# Patient Record
Sex: Female | Born: 1970 | Race: White | Hispanic: No | Marital: Single
Health system: Southern US, Community
[De-identification: ages and names within clinical notes are randomized; demographics above are authoritative.]

---

## 2000-12-18 ENCOUNTER — Emergency Department (HOSPITAL_COMMUNITY): Admission: EM | Admit: 2000-12-18 | Discharge: 2000-12-18 | Payer: Self-pay | Admitting: Emergency Medicine

## 2001-01-14 ENCOUNTER — Emergency Department (HOSPITAL_COMMUNITY): Admission: EM | Admit: 2001-01-14 | Discharge: 2001-01-15 | Payer: Self-pay | Admitting: Emergency Medicine

## 2001-07-09 ENCOUNTER — Emergency Department (HOSPITAL_COMMUNITY): Admission: EM | Admit: 2001-07-09 | Discharge: 2001-07-09 | Payer: Self-pay | Admitting: Emergency Medicine

## 2001-08-16 ENCOUNTER — Emergency Department (HOSPITAL_COMMUNITY): Admission: EM | Admit: 2001-08-16 | Discharge: 2001-08-16 | Payer: Self-pay | Admitting: *Deleted

## 2001-09-28 ENCOUNTER — Emergency Department (HOSPITAL_COMMUNITY): Admission: EM | Admit: 2001-09-28 | Discharge: 2001-09-28 | Payer: Self-pay | Admitting: Emergency Medicine

## 2001-12-22 ENCOUNTER — Emergency Department (HOSPITAL_COMMUNITY): Admission: EM | Admit: 2001-12-22 | Discharge: 2001-12-22 | Payer: Self-pay | Admitting: *Deleted

## 2001-12-25 ENCOUNTER — Emergency Department (HOSPITAL_COMMUNITY): Admission: EM | Admit: 2001-12-25 | Discharge: 2001-12-25 | Payer: Self-pay | Admitting: Emergency Medicine

## 2019-06-15 ENCOUNTER — Other Ambulatory Visit (HOSPITAL_BASED_OUTPATIENT_CLINIC_OR_DEPARTMENT_OTHER): Payer: Self-pay | Admitting: Osteopathic Medicine

## 2019-06-17 ENCOUNTER — Other Ambulatory Visit (HOSPITAL_BASED_OUTPATIENT_CLINIC_OR_DEPARTMENT_OTHER): Payer: Self-pay | Admitting: Osteopathic Medicine

## 2019-06-17 DIAGNOSIS — R1011 Right upper quadrant pain: Secondary | ICD-10-CM

## 2019-06-18 ENCOUNTER — Other Ambulatory Visit: Payer: Self-pay

## 2019-06-18 ENCOUNTER — Ambulatory Visit (HOSPITAL_BASED_OUTPATIENT_CLINIC_OR_DEPARTMENT_OTHER)
Admission: RE | Admit: 2019-06-18 | Discharge: 2019-06-18 | Disposition: A | Discharge status: Home / Self Care | Payer: 59 | Source: Ambulatory Visit | Attending: Osteopathic Medicine | Admitting: Osteopathic Medicine

## 2019-06-18 DIAGNOSIS — R1011 Right upper quadrant pain: Secondary | ICD-10-CM

## 2020-12-22 ENCOUNTER — Ambulatory Visit: Payer: 59 | Admitting: Family Medicine

## 2020-12-24 ENCOUNTER — Other Ambulatory Visit: Payer: Self-pay

## 2020-12-24 ENCOUNTER — Ambulatory Visit (HOSPITAL_BASED_OUTPATIENT_CLINIC_OR_DEPARTMENT_OTHER)
Admission: RE | Admit: 2020-12-24 | Discharge: 2020-12-24 | Disposition: A | Discharge status: Home / Self Care | Payer: BC Managed Care – PPO | Source: Ambulatory Visit | Attending: Family Medicine | Admitting: Family Medicine

## 2020-12-24 ENCOUNTER — Ambulatory Visit (INDEPENDENT_AMBULATORY_CARE_PROVIDER_SITE_OTHER): Payer: PRIVATE HEALTH INSURANCE | Admitting: Family Medicine

## 2020-12-24 ENCOUNTER — Ambulatory Visit: Payer: Self-pay

## 2020-12-24 ENCOUNTER — Encounter: Payer: Self-pay | Admitting: Family Medicine

## 2020-12-24 VITALS — BP 130/82 | Ht 66.5 in | Wt 233.0 lb

## 2020-12-24 DIAGNOSIS — S92302A Fracture of unspecified metatarsal bone(s), left foot, initial encounter for closed fracture: Secondary | ICD-10-CM | POA: Insufficient documentation

## 2020-12-24 DIAGNOSIS — M79672 Pain in left foot: Secondary | ICD-10-CM

## 2020-12-24 NOTE — Patient Instructions (Signed)
Nice to meet you Please use ice as needed  Please continue the boot  I will call with the results from today  Please send me a message in MyChart with any questions or updates.  Please see me back in 4 weeks.   --Dr. Jordan Likes

## 2020-12-24 NOTE — Assessment & Plan Note (Signed)
Injury was at the end of September at work.  Having callus formation appreciated on ultrasound of the fracture. -Counseled on home exercise therapy and supportive care. -Cam walker. -X-ray. -Provided prescription for Rollator.

## 2020-12-24 NOTE — Progress Notes (Signed)
  Breanna Boyle - 50 y.o. female MRN [id number]  Date of birth: [birth year]  SUBJECTIVE:  Including CC & ROS.  No chief complaint on file.   Breanna Boyle is a 50 y.o. female that is  presenting with left foot pain.  She had a fall at work at the end of September.  She initially hurt her knee but now her left foot is the issue.  She is a cam walker after being found to have a fracture of the fifth metatarsal at the urgent care.  The pain has improved since being in the cam walker   Review of Systems See HPI   HISTORY: Past Medical, Surgical, Social, and Family History Reviewed & Updated per EMR.   Pertinent Historical Findings include:  History reviewed. No pertinent past medical history.  History reviewed. No pertinent surgical history.  History reviewed. No pertinent family history.  Social History   Socioeconomic History   Marital status: Single    Spouse name: Not on file   Number of children: Not on file   Years of education: Not on file   Highest education level: Not on file  Occupational History   Not on file  Tobacco Use   Smoking status: Not on file   Smokeless tobacco: Not on file  Substance and Sexual Activity   Alcohol use: Not on file   Drug use: Not on file   Sexual activity: Not on file  Other Topics Concern   Not on file  Social History Narrative   Not on file   Social Determinants of Health   Financial Resource Strain: Not on file  Food Insecurity: Not on file  Transportation Needs: Not on file  Physical Activity: Not on file  Stress: Not on file  Social Connections: Not on file  Intimate Partner Violence: Not on file     PHYSICAL EXAM:  VS: BP 130/82 (BP Location: Left Arm, Patient Position: Sitting)   Ht 5' 6.5" (1.689 m)   Wt 233 lb (105.7 kg)   BMI 37.04 kg/m  Physical Exam Gen: NAD, alert, cooperative with exam, well-appearing  Limited ultrasound: left foot:  No effusion within the ankle joint. Mild effusion encircling the peroneal  tendons. Normal insertion of the peroneal brevis into the base of the fifth metatarsal. Transverse oblique fracture nondisplaced of the left fifth metatarsal  Summary: Oblique nondisplaced fracture left fifth metatarsal shaft.  Ultrasound and interpretation by Clare Gandy, MD    ASSESSMENT & PLAN:   Closed fracture of metatarsal shaft, left, initial encounter Injury was at the end of September at work.  Having callus formation appreciated on ultrasound of the fracture. -Counseled on home exercise therapy and supportive care. -Cam walker. -X-ray. -Provided prescription for Rollator.

## 2020-12-28 ENCOUNTER — Telehealth: Payer: Self-pay | Admitting: Family Medicine

## 2020-12-28 NOTE — Telephone Encounter (Signed)
Left VM for patient. If she calls back please have her speak with a nurse/CMA and inform that the xray is showing healing of the fracture. Continue with plan and follow up.   If any questions then please take the best time and phone number to call and I will try to call her back.   Myra Rude, MD [facility] 12/28/2020, 3:39 PM

## 2020-12-28 NOTE — Telephone Encounter (Signed)
Pt informed of below.  

## 2021-01-21 ENCOUNTER — Ambulatory Visit: Payer: BC Managed Care – PPO | Admitting: Family Medicine

## 2021-01-25 ENCOUNTER — Ambulatory Visit: Payer: BC Managed Care – PPO | Admitting: Family Medicine

## 2021-11-20 IMAGING — US US ABDOMEN LIMITED
1 series · 14 of 25 positions shown · non-contrast
Comparison: 05/18/2019

CLINICAL DATA: Right upper quadrant pain

EXAM:
ULTRASOUND ABDOMEN LIMITED RIGHT UPPER QUADRANT

[Series 1: us abdomen limited · 14 of 28 slices shown]
[im 1/28]
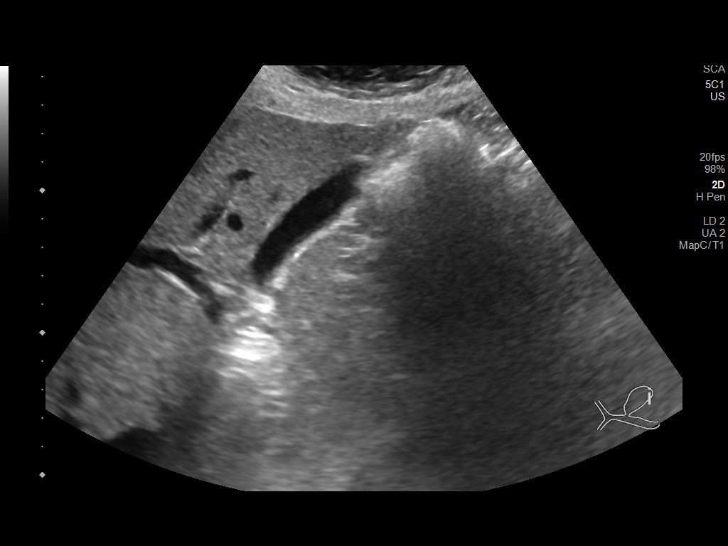
[im 3/28]
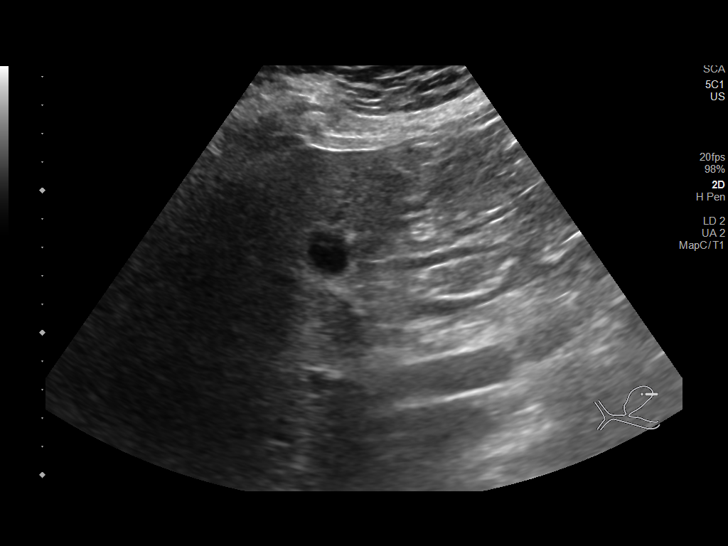
[im 5/28]
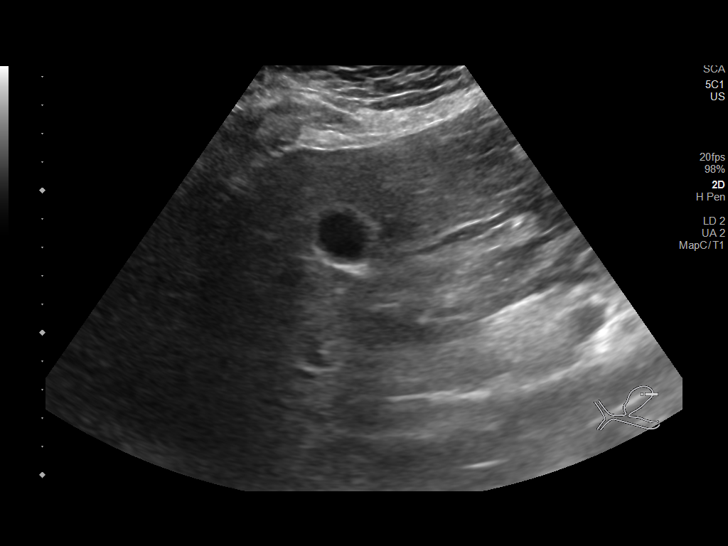
[im 7/28]
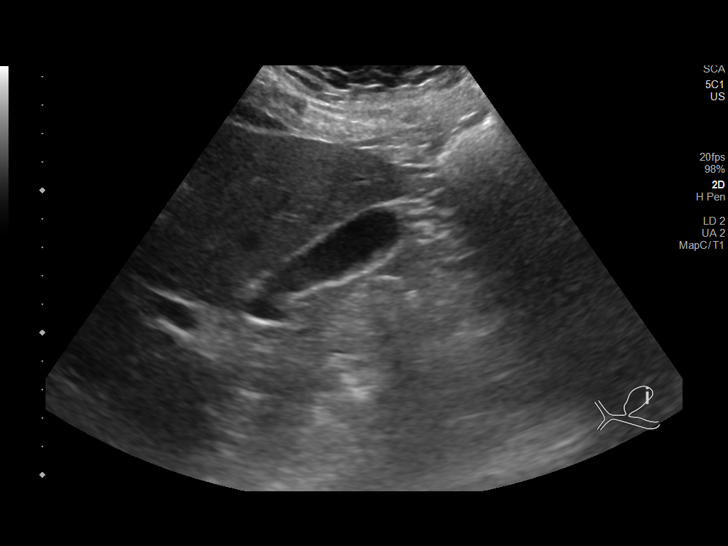
[im 10/28]
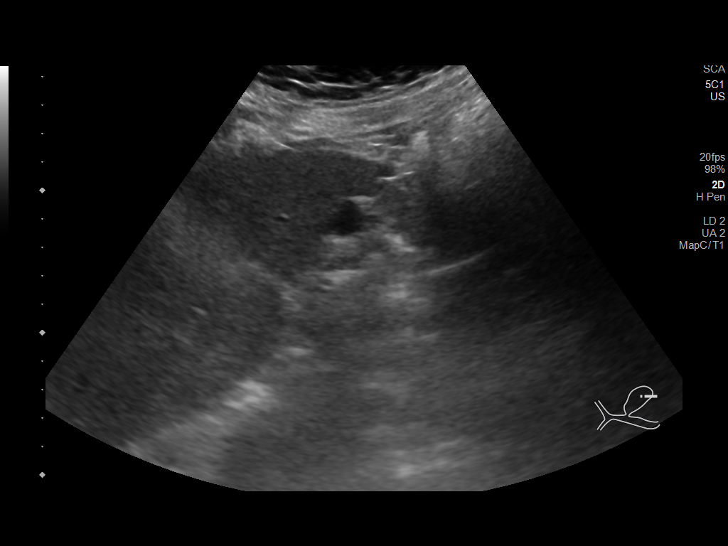
[im 11/28]
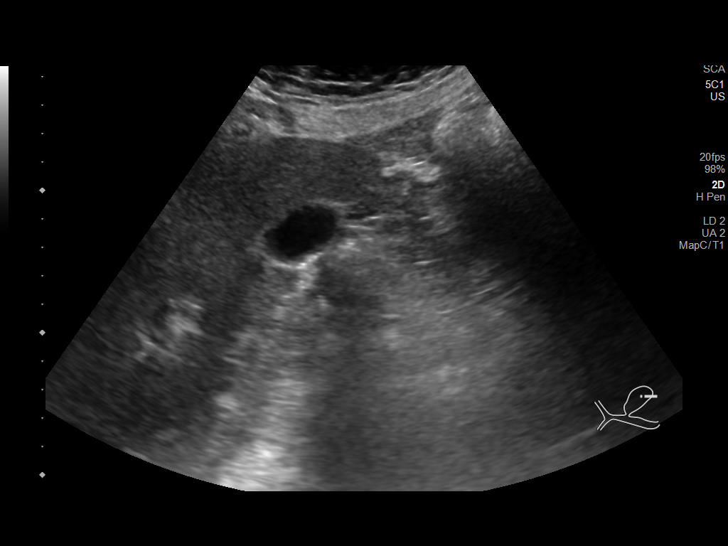
[im 13/28]
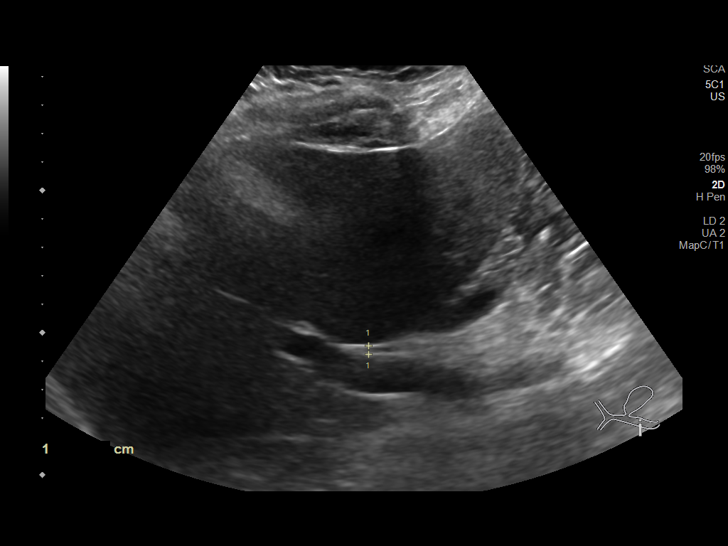
[im 15/28]
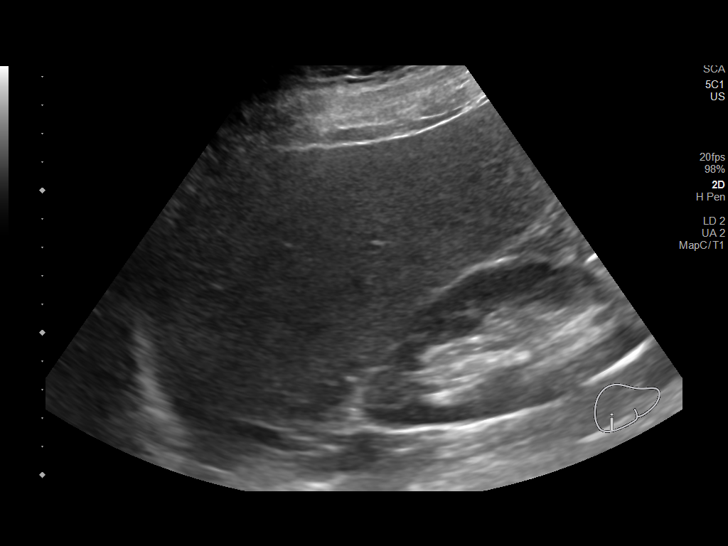
[im 17/28]
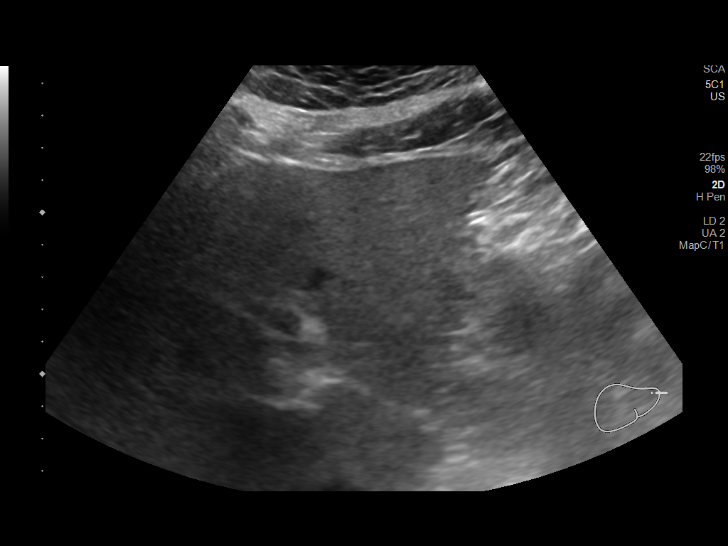
[im 19/28]
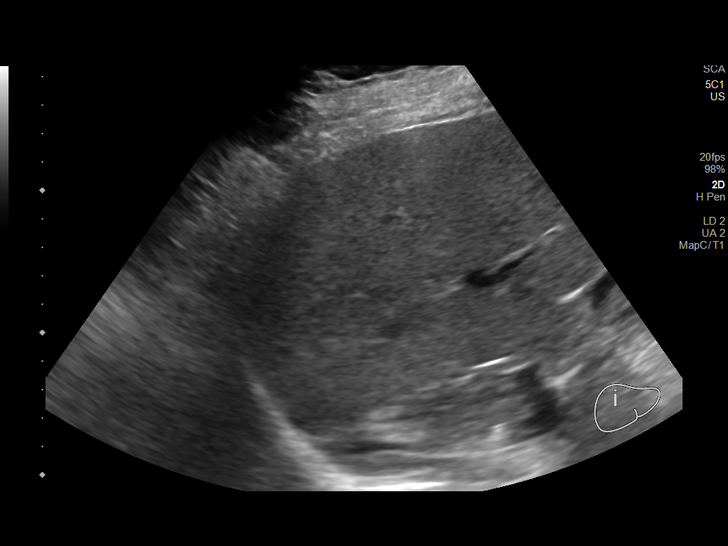
[im 21/28]
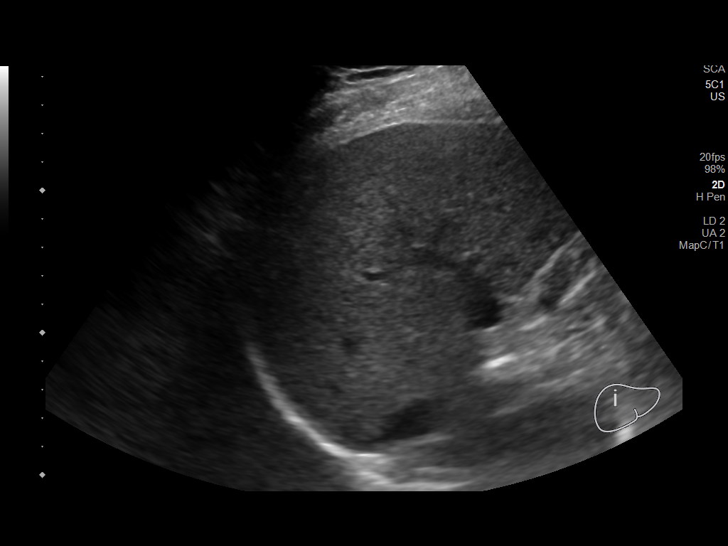
[im 23/28]
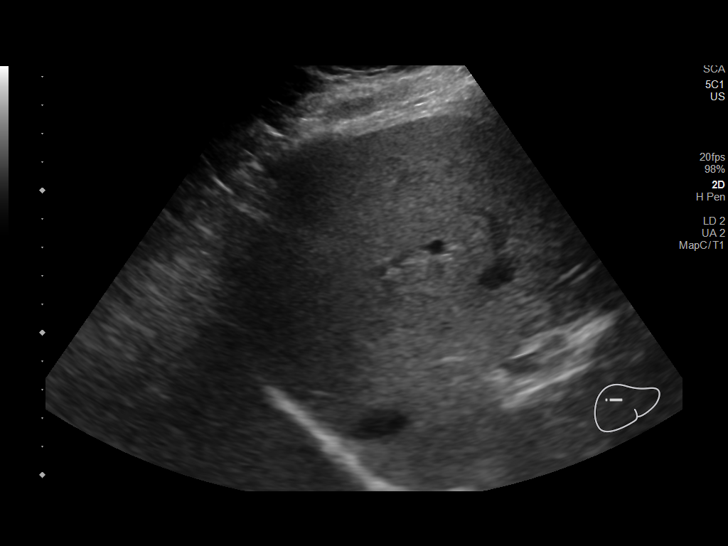
[im 25/28]
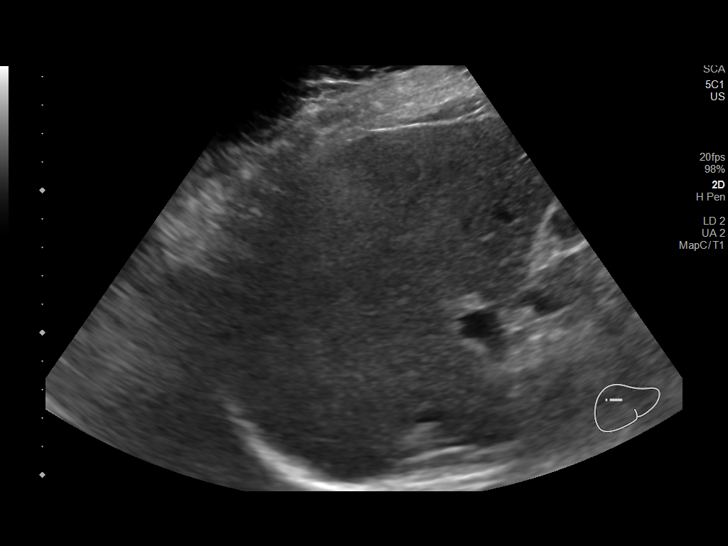
[im 28/28]
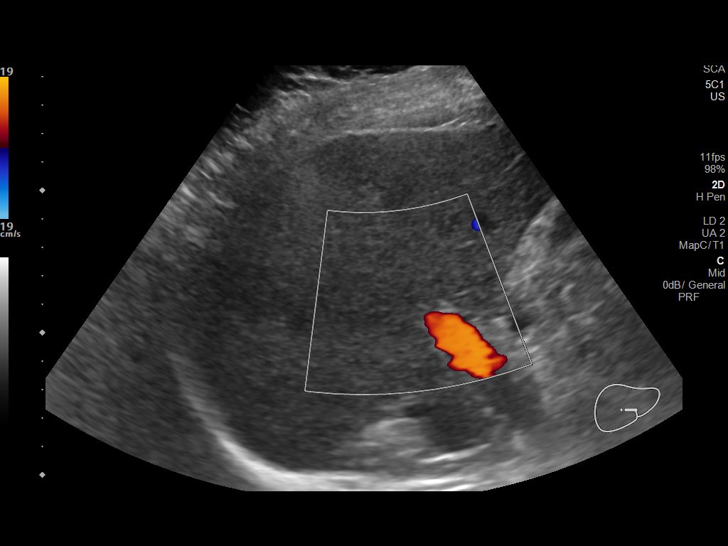

[14 of 25 positions shown; findings below may reference images not displayed]

FINDINGS: Gallbladder:

Gallbladder is contracted which does somewhat limit the evaluation.
No evidence of cholelithiasis. No gallbladder wall thickening or
pericholecystic fluid.

Common bile duct:

Diameter: 3 mm

Liver:

No focal lesion identified. Within normal limits in parenchymal
echogenicity. Portal vein is patent on color Doppler imaging with
normal direction of blood flow towards the liver.

Other: None.
IMPRESSION: 1. Contracted gallbladder, without evidence of cholelithiasis or
cholecystitis.
2. Otherwise unremarkable right upper quadrant ultrasound.

## 2022-05-31 ENCOUNTER — Encounter: Payer: Self-pay | Admitting: *Deleted

## 2023-05-29 IMAGING — DX DG FOOT COMPLETE 3+V*L*
3 series · 3 of 3 positions shown · non-contrast
Comparison: None.

CLINICAL DATA: Left fifth metatarsal fracture

EXAM:
LEFT FOOT - COMPLETE 3+ VIEW

[foot ap]
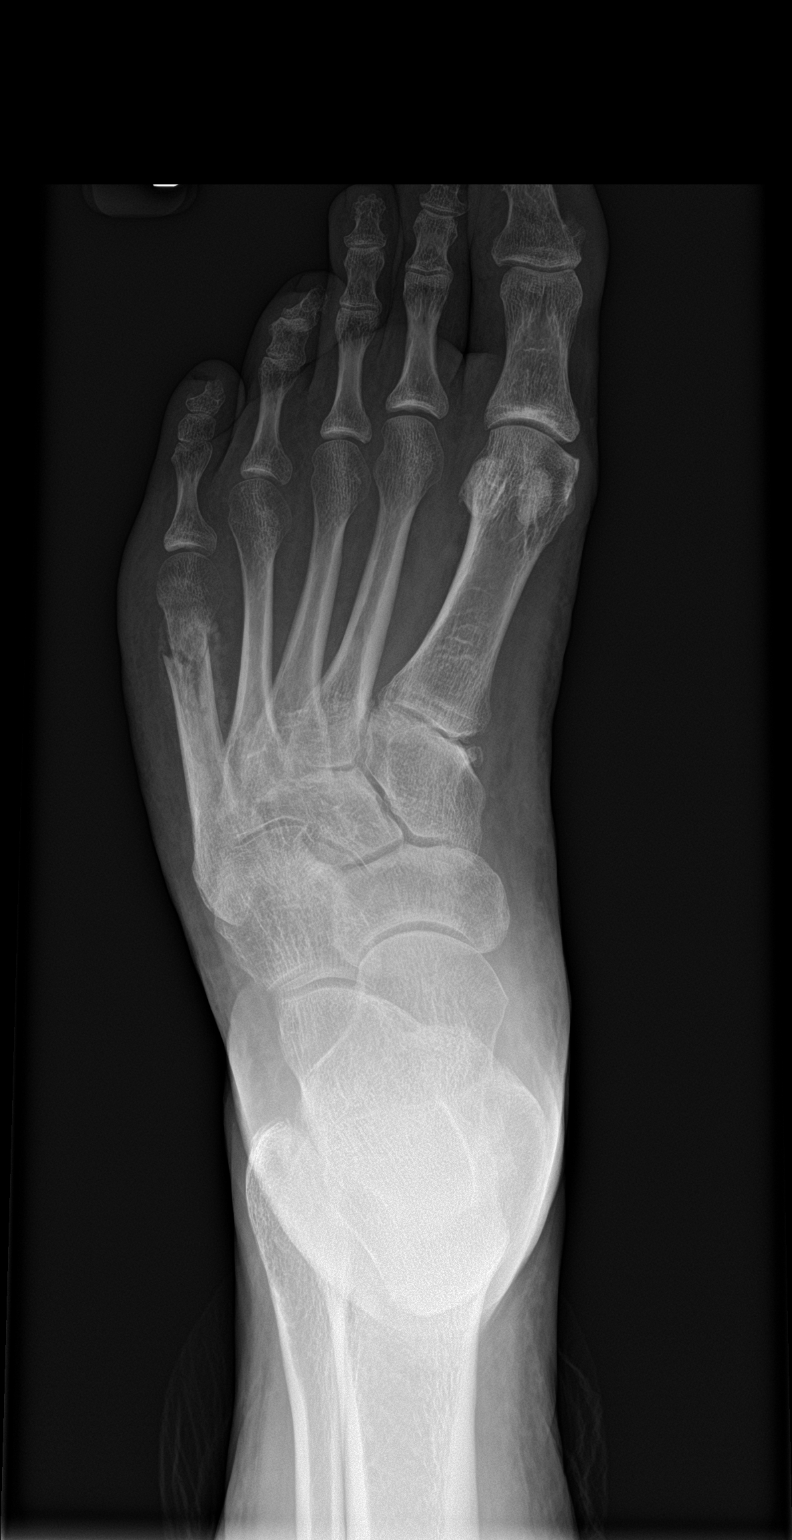

[foot obl]
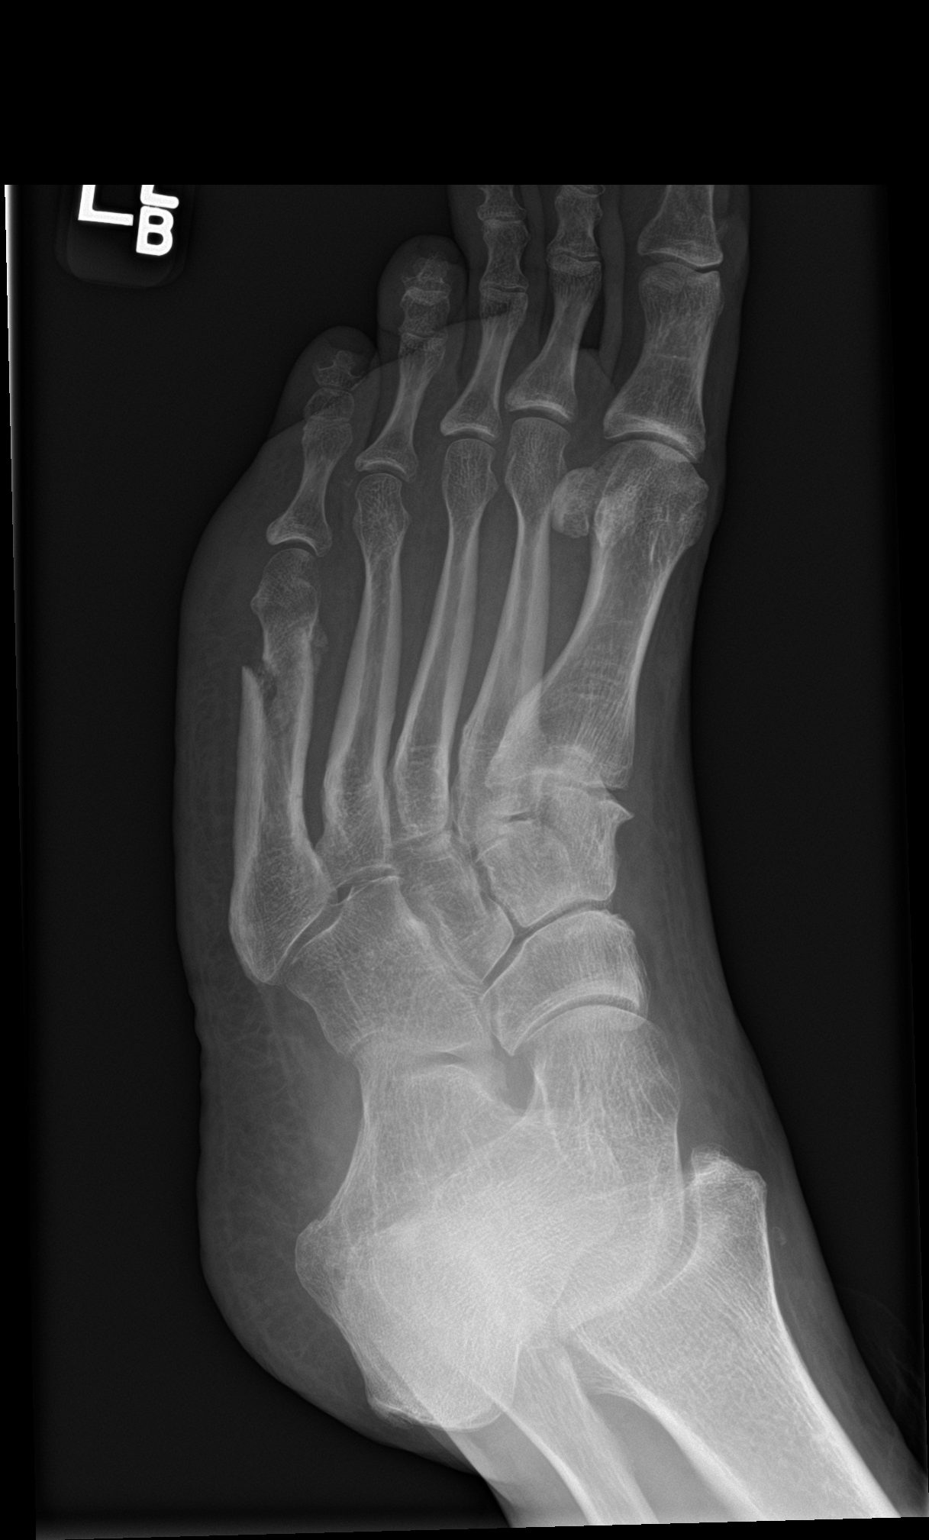

[foot lat]
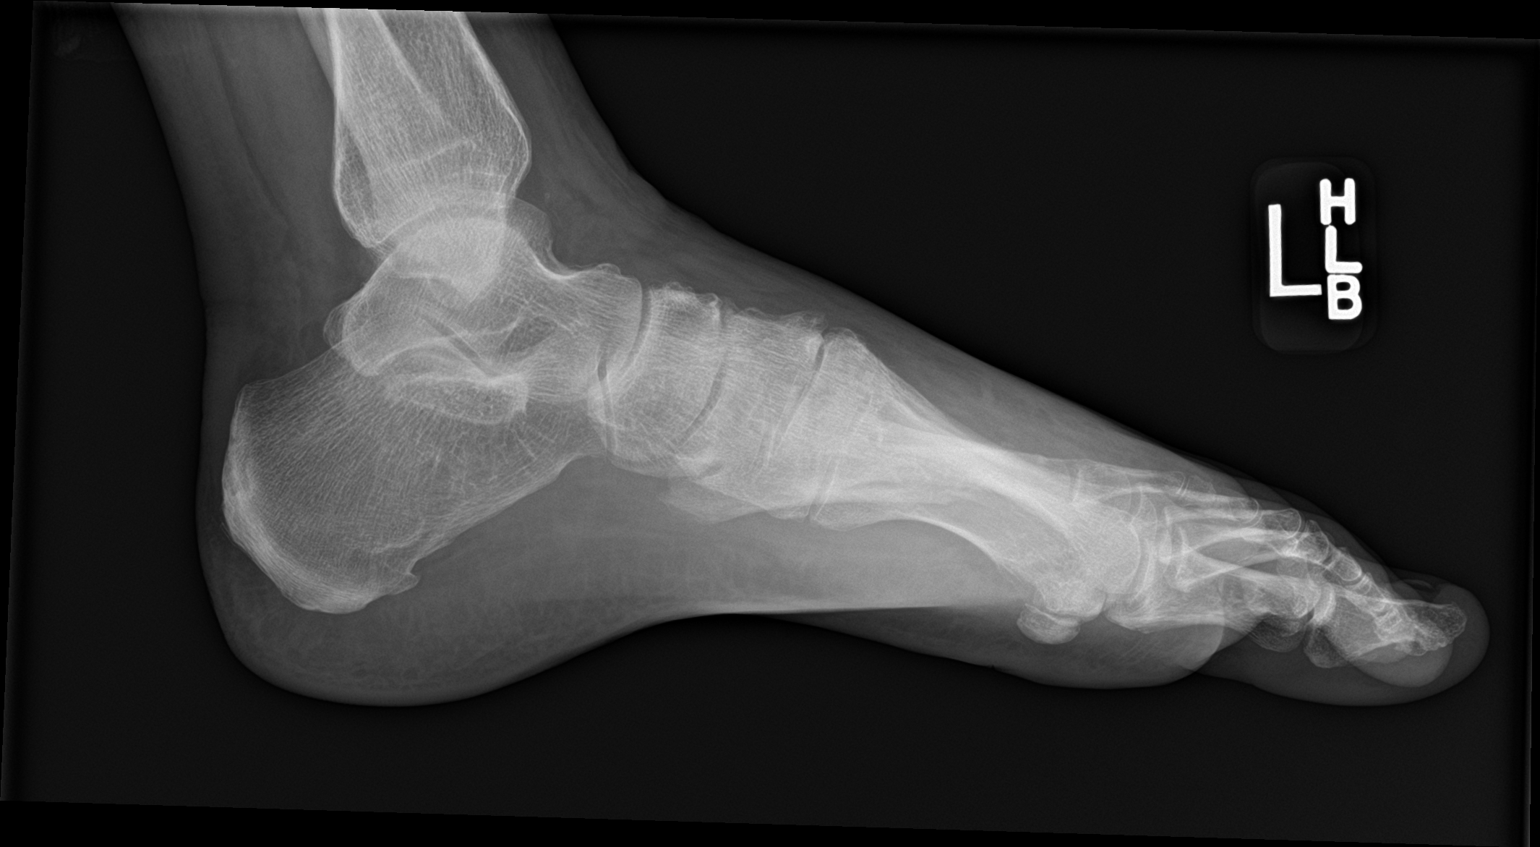

[3 of 3 positions shown; findings below may reference images not displayed]

FINDINGS: There is a subacute, mildly comminuted, oblique fracture of the
distal left fifth metatarsal diaphysis with fracture fragments in
near anatomic alignment and surrounding non bridging callus
formation in keeping with incomplete healing. Moderate surrounding
soft tissue swelling. No other fracture or dislocation. Mild midfoot
degenerative arthritis. Remaining joint spaces appear preserved.
IMPRESSION: Incompletely healed, aligned distal diaphyseal fracture of the left
fifth metatarsal with moderate surrounding soft tissue swelling.
